# Patient Record
Sex: Female | Born: 1995 | Race: White | Hispanic: No | Marital: Single | Smoking: Never smoker
Health system: Southern US, Community
[De-identification: ages and names within clinical notes are randomized; demographics above are authoritative.]

---
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
## 2017-04-01 DIAGNOSIS — Z3046 Encounter for surveillance of implantable subdermal contraceptive: Secondary | ICD-10-CM | POA: Diagnosis not present

## 2017-04-01 DIAGNOSIS — N930 Postcoital and contact bleeding: Secondary | ICD-10-CM | POA: Diagnosis not present

## 2017-04-01 DIAGNOSIS — Z1389 Encounter for screening for other disorder: Secondary | ICD-10-CM | POA: Diagnosis not present

## 2017-04-01 DIAGNOSIS — Z6824 Body mass index (BMI) 24.0-24.9, adult: Secondary | ICD-10-CM | POA: Diagnosis not present

## 2017-04-01 DIAGNOSIS — Z130 Encounter for screening for diseases of the blood and blood-forming organs and certain disorders involving the immune mechanism: Secondary | ICD-10-CM | POA: Diagnosis not present

## 2017-04-01 DIAGNOSIS — Z01419 Encounter for gynecological examination (general) (routine) without abnormal findings: Secondary | ICD-10-CM | POA: Diagnosis not present

## 2017-06-23 DIAGNOSIS — J0100 Acute maxillary sinusitis, unspecified: Secondary | ICD-10-CM | POA: Diagnosis not present

## 2017-12-29 DIAGNOSIS — Z131 Encounter for screening for diabetes mellitus: Secondary | ICD-10-CM | POA: Diagnosis not present

## 2017-12-29 DIAGNOSIS — Z0000 Encounter for general adult medical examination without abnormal findings: Secondary | ICD-10-CM | POA: Diagnosis not present

## 2017-12-29 DIAGNOSIS — Z8349 Family history of other endocrine, nutritional and metabolic diseases: Secondary | ICD-10-CM | POA: Diagnosis not present

## 2018-06-06 DIAGNOSIS — Z01419 Encounter for gynecological examination (general) (routine) without abnormal findings: Secondary | ICD-10-CM | POA: Diagnosis not present

## 2018-06-06 DIAGNOSIS — Z6826 Body mass index (BMI) 26.0-26.9, adult: Secondary | ICD-10-CM | POA: Diagnosis not present

## 2018-06-06 DIAGNOSIS — Z1389 Encounter for screening for other disorder: Secondary | ICD-10-CM | POA: Diagnosis not present

## 2018-06-06 DIAGNOSIS — Z3046 Encounter for surveillance of implantable subdermal contraceptive: Secondary | ICD-10-CM | POA: Diagnosis not present

## 2018-11-02 DIAGNOSIS — D2230 Melanocytic nevi of unspecified part of face: Secondary | ICD-10-CM | POA: Diagnosis not present

## 2018-11-02 DIAGNOSIS — Z86018 Personal history of other benign neoplasm: Secondary | ICD-10-CM | POA: Diagnosis not present

## 2018-11-02 DIAGNOSIS — L814 Other melanin hyperpigmentation: Secondary | ICD-10-CM | POA: Diagnosis not present

## 2018-11-02 DIAGNOSIS — D225 Melanocytic nevi of trunk: Secondary | ICD-10-CM | POA: Diagnosis not present

## 2019-01-02 DIAGNOSIS — Z0000 Encounter for general adult medical examination without abnormal findings: Secondary | ICD-10-CM | POA: Diagnosis not present

## 2019-01-20 DIAGNOSIS — Z20828 Contact with and (suspected) exposure to other viral communicable diseases: Secondary | ICD-10-CM | POA: Diagnosis not present

## 2019-02-07 DIAGNOSIS — J3081 Allergic rhinitis due to animal (cat) (dog) hair and dander: Secondary | ICD-10-CM | POA: Diagnosis not present

## 2019-02-07 DIAGNOSIS — R05 Cough: Secondary | ICD-10-CM | POA: Diagnosis not present

## 2019-02-07 DIAGNOSIS — J301 Allergic rhinitis due to pollen: Secondary | ICD-10-CM | POA: Diagnosis not present

## 2019-02-07 DIAGNOSIS — J3089 Other allergic rhinitis: Secondary | ICD-10-CM | POA: Diagnosis not present

## 2019-02-08 ENCOUNTER — Other Ambulatory Visit: Payer: Self-pay | Admitting: Allergy and Immunology

## 2019-02-08 ENCOUNTER — Ambulatory Visit
Admission: RE | Admit: 2019-02-08 | Discharge: 2019-02-08 | Disposition: A | Discharge status: Home / Self Care | Payer: BLUE CROSS/BLUE SHIELD | Source: Ambulatory Visit | Attending: Allergy and Immunology | Admitting: Allergy and Immunology

## 2019-02-08 ENCOUNTER — Other Ambulatory Visit: Payer: Self-pay

## 2019-02-08 DIAGNOSIS — R059 Cough, unspecified: Secondary | ICD-10-CM

## 2019-02-08 DIAGNOSIS — R05 Cough: Secondary | ICD-10-CM

## 2019-02-15 DIAGNOSIS — Z20828 Contact with and (suspected) exposure to other viral communicable diseases: Secondary | ICD-10-CM | POA: Diagnosis not present

## 2019-04-15 DIAGNOSIS — Z20828 Contact with and (suspected) exposure to other viral communicable diseases: Secondary | ICD-10-CM | POA: Diagnosis not present

## 2019-05-10 DIAGNOSIS — Z20828 Contact with and (suspected) exposure to other viral communicable diseases: Secondary | ICD-10-CM | POA: Diagnosis not present

## 2019-05-10 DIAGNOSIS — R0981 Nasal congestion: Secondary | ICD-10-CM | POA: Diagnosis not present

## 2019-05-12 DIAGNOSIS — Z20828 Contact with and (suspected) exposure to other viral communicable diseases: Secondary | ICD-10-CM | POA: Diagnosis not present

## 2019-06-30 DIAGNOSIS — Z6828 Body mass index (BMI) 28.0-28.9, adult: Secondary | ICD-10-CM | POA: Diagnosis not present

## 2019-06-30 DIAGNOSIS — Z01419 Encounter for gynecological examination (general) (routine) without abnormal findings: Secondary | ICD-10-CM | POA: Diagnosis not present

## 2019-06-30 DIAGNOSIS — Z124 Encounter for screening for malignant neoplasm of cervix: Secondary | ICD-10-CM | POA: Diagnosis not present

## 2019-06-30 DIAGNOSIS — Z130 Encounter for screening for diseases of the blood and blood-forming organs and certain disorders involving the immune mechanism: Secondary | ICD-10-CM | POA: Diagnosis not present

## 2019-07-03 DIAGNOSIS — Z20828 Contact with and (suspected) exposure to other viral communicable diseases: Secondary | ICD-10-CM | POA: Diagnosis not present

## 2019-07-10 DIAGNOSIS — R1900 Intra-abdominal and pelvic swelling, mass and lump, unspecified site: Secondary | ICD-10-CM | POA: Diagnosis not present

## 2019-09-11 DIAGNOSIS — J301 Allergic rhinitis due to pollen: Secondary | ICD-10-CM | POA: Diagnosis not present

## 2019-09-11 DIAGNOSIS — J3081 Allergic rhinitis due to animal (cat) (dog) hair and dander: Secondary | ICD-10-CM | POA: Diagnosis not present

## 2019-09-11 DIAGNOSIS — J3089 Other allergic rhinitis: Secondary | ICD-10-CM | POA: Diagnosis not present

## 2019-09-11 DIAGNOSIS — R05 Cough: Secondary | ICD-10-CM | POA: Diagnosis not present

## 2019-11-02 DIAGNOSIS — D225 Melanocytic nevi of trunk: Secondary | ICD-10-CM | POA: Diagnosis not present

## 2019-11-02 DIAGNOSIS — D2230 Melanocytic nevi of unspecified part of face: Secondary | ICD-10-CM | POA: Diagnosis not present

## 2019-11-02 DIAGNOSIS — L814 Other melanin hyperpigmentation: Secondary | ICD-10-CM | POA: Diagnosis not present

## 2019-11-02 DIAGNOSIS — Z86018 Personal history of other benign neoplasm: Secondary | ICD-10-CM | POA: Diagnosis not present

## 2020-01-04 DIAGNOSIS — Z131 Encounter for screening for diabetes mellitus: Secondary | ICD-10-CM | POA: Diagnosis not present

## 2020-01-04 DIAGNOSIS — Z0000 Encounter for general adult medical examination without abnormal findings: Secondary | ICD-10-CM | POA: Diagnosis not present

## 2020-02-18 DIAGNOSIS — Z20822 Contact with and (suspected) exposure to covid-19: Secondary | ICD-10-CM | POA: Diagnosis not present

## 2020-06-05 ENCOUNTER — Encounter: Payer: Self-pay | Admitting: Family Medicine

## 2020-06-05 ENCOUNTER — Encounter (INDEPENDENT_AMBULATORY_CARE_PROVIDER_SITE_OTHER): Payer: BLUE CROSS/BLUE SHIELD | Admitting: Family Medicine

## 2020-06-05 ENCOUNTER — Other Ambulatory Visit: Payer: Self-pay

## 2020-06-17 ENCOUNTER — Ambulatory Visit: Payer: BLUE CROSS/BLUE SHIELD | Attending: Internal Medicine

## 2020-06-17 ENCOUNTER — Other Ambulatory Visit: Payer: Self-pay

## 2020-06-17 DIAGNOSIS — Z23 Encounter for immunization: Secondary | ICD-10-CM

## 2020-06-17 NOTE — Progress Notes (Signed)
   [facility] Vaccination [facility]  Name:  Lindsay Torres    MRN: [id number] DOB: 1995/07/05  06/17/2020  [name] was observed post Covid-19 immunization for 15 minutes without incident. She was provided with Vaccine Information Sheet and instruction to access the V-Safe system.   [name] was instructed to call 911 with any severe reactions post vaccine: Marland Kitchen Difficulty breathing  . Swelling of face and throat  . A fast heartbeat  . A bad rash all over body  . Dizziness and weakness   Immunizations Administered    Name Date Dose VIS Date Route   Moderna Covid-19 Booster Vaccine 06/17/2020  1:15 PM 0.25 mL 04/10/2020 Intramuscular   Manufacturer: Gala Murdoch   Lot: [id number]   NDC: 84536-468-03

## 2020-06-26 DIAGNOSIS — Z20822 Contact with and (suspected) exposure to covid-19: Secondary | ICD-10-CM | POA: Diagnosis not present

## 2020-06-27 DIAGNOSIS — J301 Allergic rhinitis due to pollen: Secondary | ICD-10-CM | POA: Diagnosis not present

## 2020-06-27 DIAGNOSIS — J3081 Allergic rhinitis due to animal (cat) (dog) hair and dander: Secondary | ICD-10-CM | POA: Diagnosis not present

## 2020-06-27 DIAGNOSIS — K219 Gastro-esophageal reflux disease without esophagitis: Secondary | ICD-10-CM | POA: Diagnosis not present

## 2020-06-27 DIAGNOSIS — J3089 Other allergic rhinitis: Secondary | ICD-10-CM | POA: Diagnosis not present

## 2020-09-29 IMAGING — CR CHEST - 2 VIEW
2 series · 2 of 2 positions shown · non-contrast
Comparison: None.

CLINICAL DATA: Cough

EXAM:
CHEST - 2 VIEW

[w chest pa]
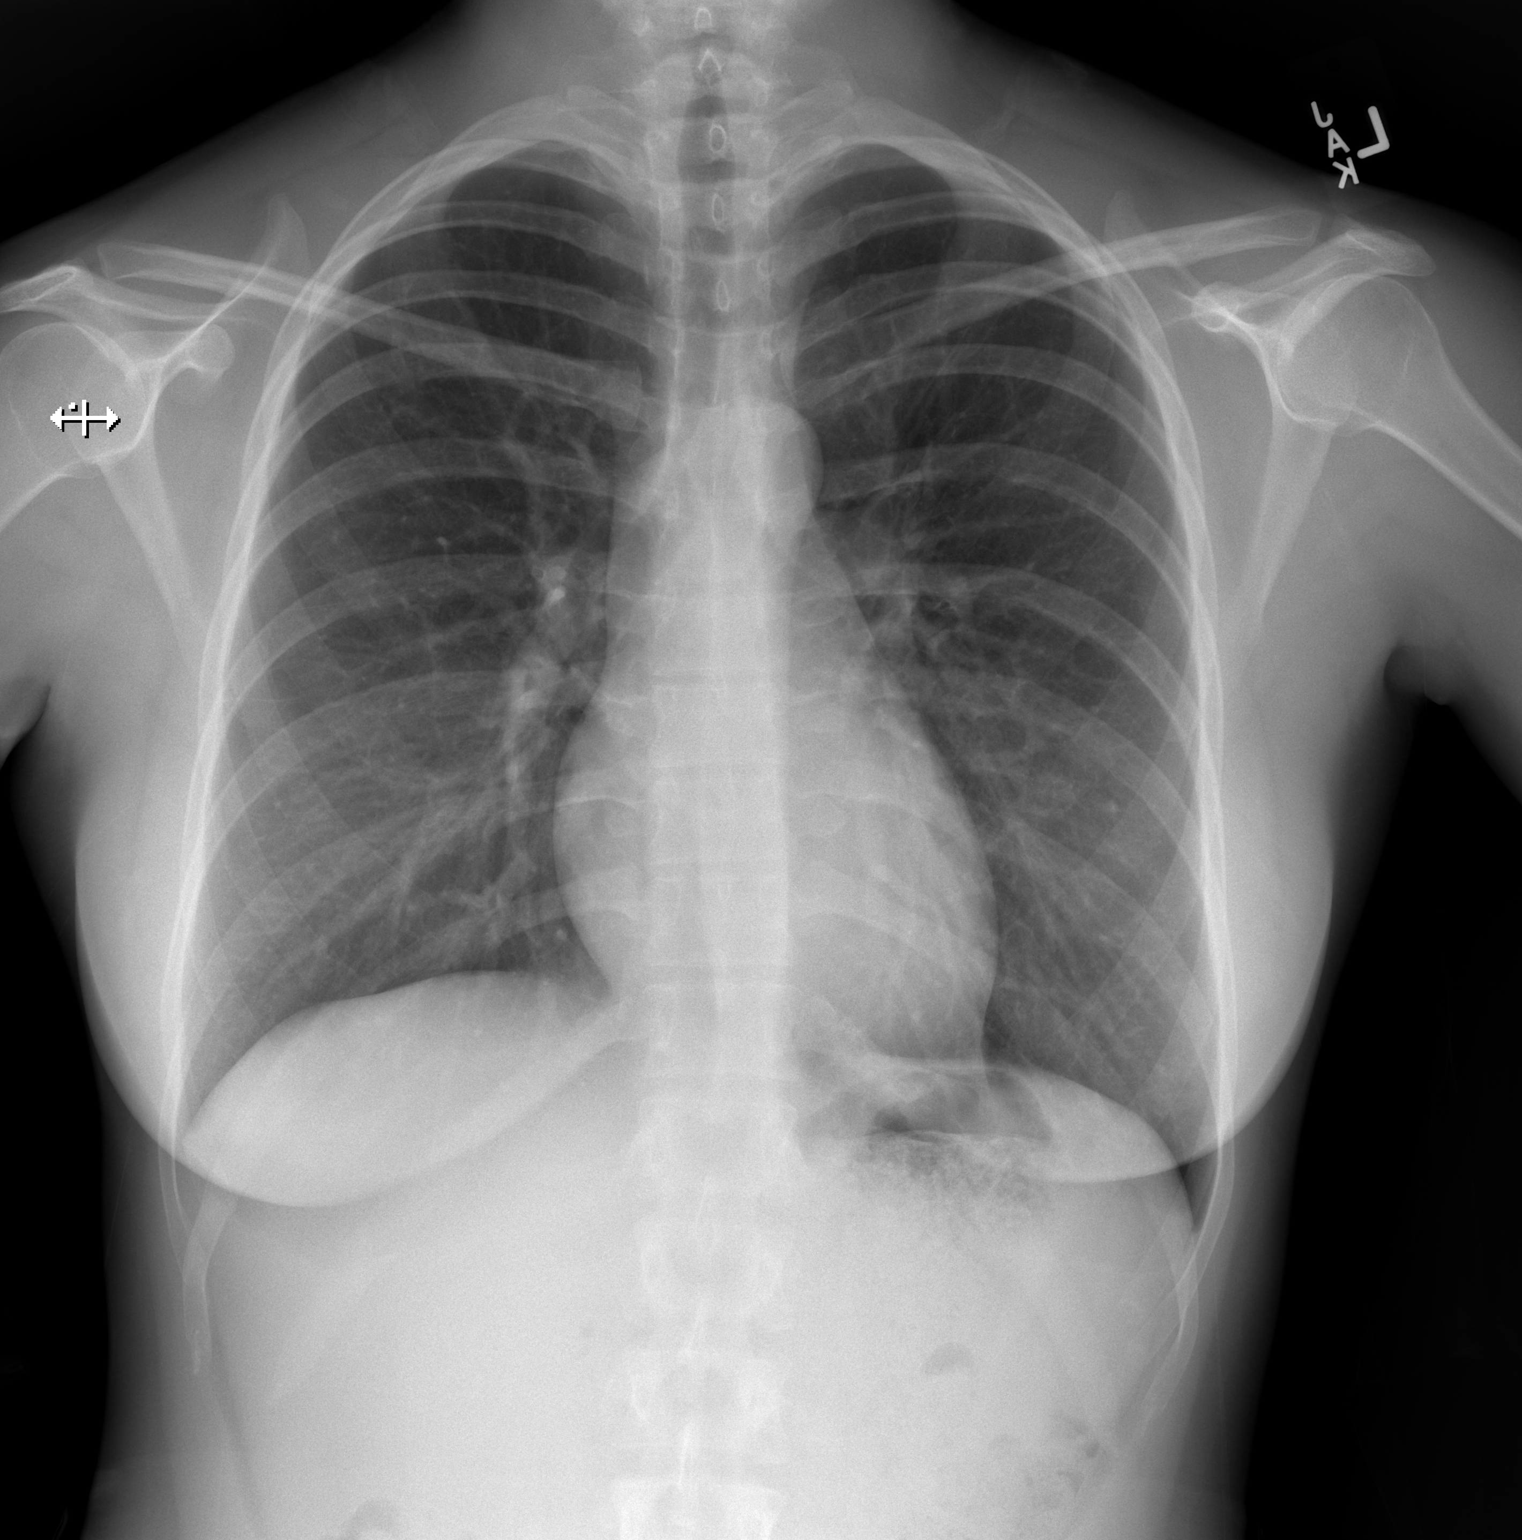

[w chest lat]
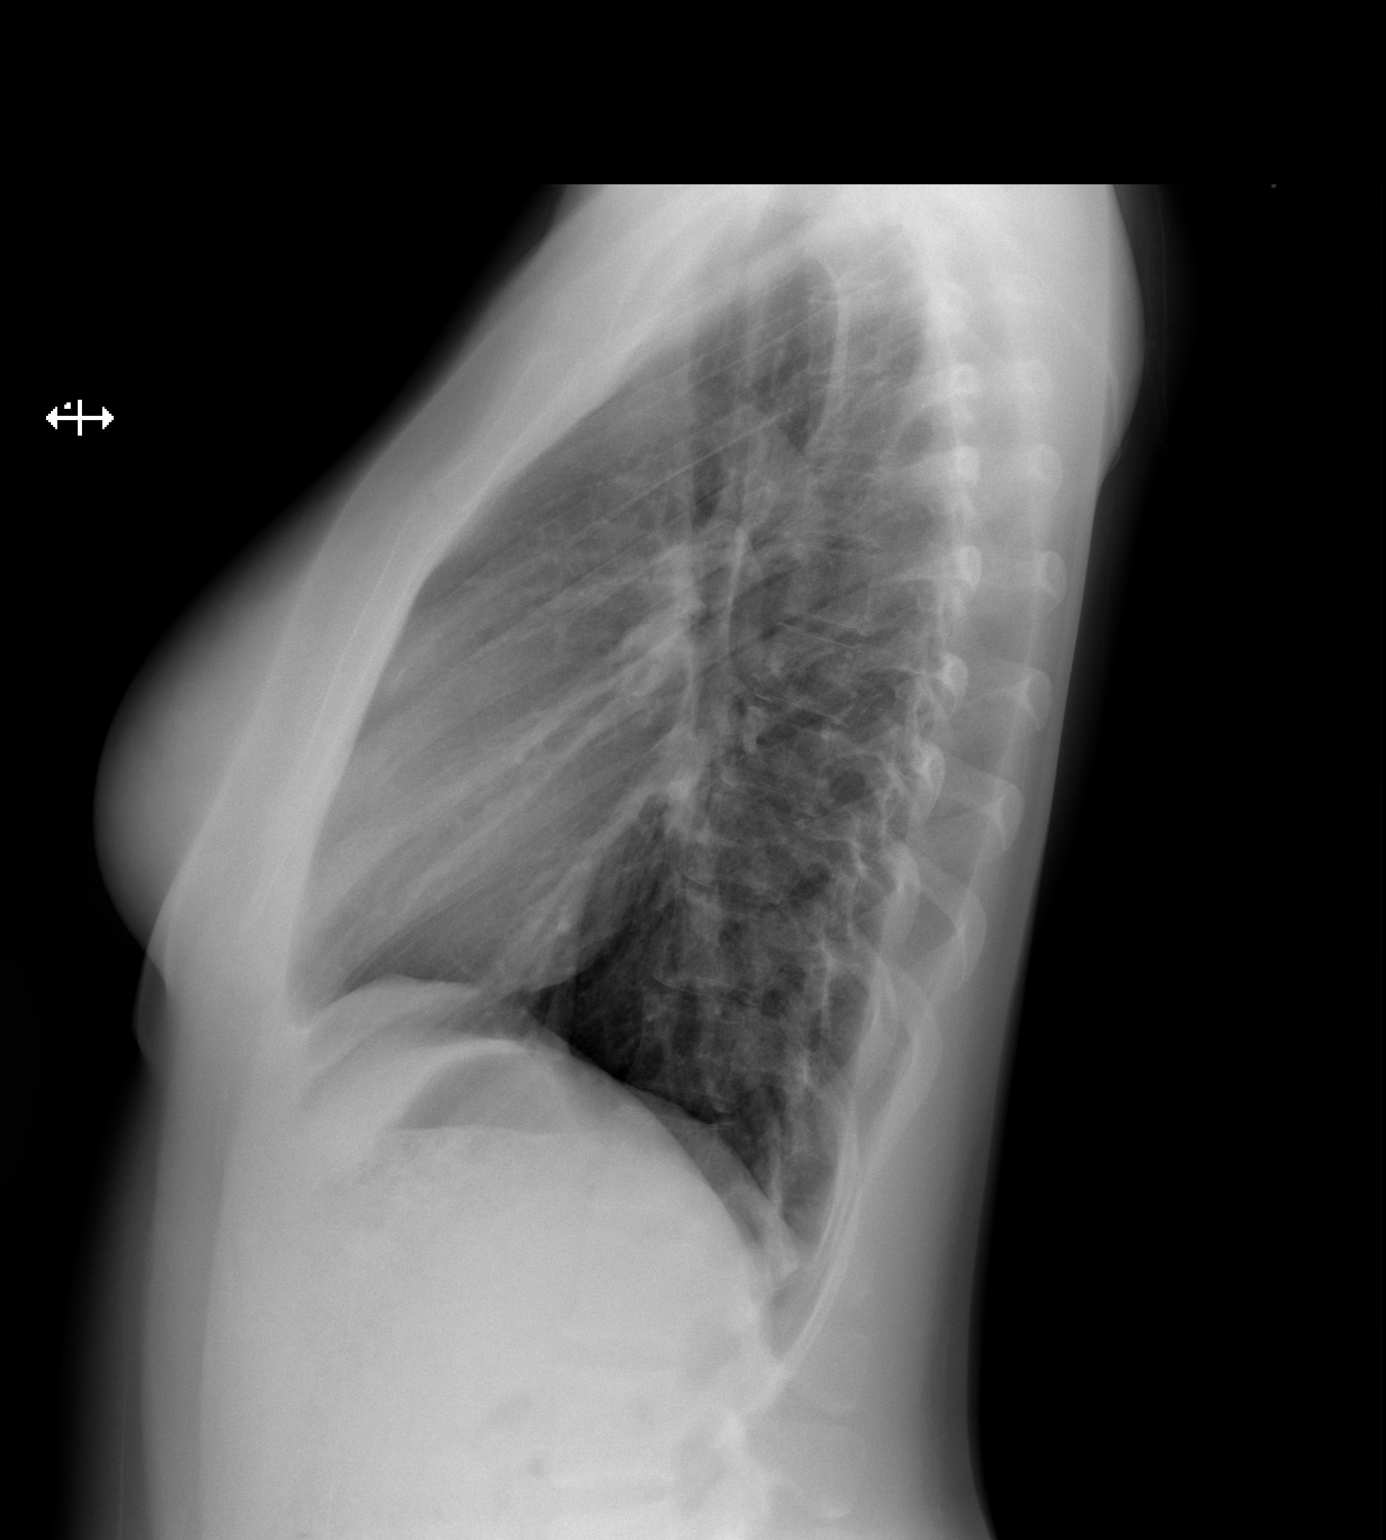

[2 of 2 positions shown; findings below may reference images not displayed]

FINDINGS: Lungs are clear. Heart size and pulmonary vascularity are normal. No
adenopathy. No bone lesions.
IMPRESSION: No edema or consolidation.

## 2024-04-03 ENCOUNTER — Other Ambulatory Visit: Payer: Self-pay | Admitting: Internal Medicine

## 2024-04-03 DIAGNOSIS — N6321 Unspecified lump in the left breast, upper outer quadrant: Secondary | ICD-10-CM

## 2024-04-13 ENCOUNTER — Ambulatory Visit
Admission: RE | Admit: 2024-04-13 | Discharge: 2024-04-13 | Disposition: A | Source: Ambulatory Visit | Attending: Internal Medicine

## 2024-04-13 DIAGNOSIS — N6321 Unspecified lump in the left breast, upper outer quadrant: Secondary | ICD-10-CM
